# Patient Record
Sex: Male | Born: 2000 | Race: White | Hispanic: No | Marital: Single | Smoking: Never smoker
Health system: Southern US, Community
[De-identification: ages and names within clinical notes are randomized; demographics above are authoritative.]

---
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
## 2024-08-09 ENCOUNTER — Ambulatory Visit: Payer: Self-pay | Admitting: *Deleted

## 2024-08-09 NOTE — Telephone Encounter (Signed)
 FYI Only or Action Required?: FYI only for provider: recommended UC , no PCP.  Patient was last seen in primary care on na.  Called Nurse Triage reporting Sore Throat.  Symptoms began several days ago. 2 days ago   Interventions attempted: OTC medications: tylenol motrin.  Symptoms are: gradually worsening.  Triage Disposition: See Physician Within 24 Hours  Patient/caregiver understands and will follow disposition?: Yes     Recommended UC and patient declined scheduling new patient appt at this time. Recommended if sx worsen go to ED.              Copied from CRM #[id number]. Topic: Clinical - Red Word Triage >> Aug 09, 2024  9:28 AM Matthew Rice wrote: Red Word that prompted transfer to Nurse Triage: fever 101 this morning,sore throat, join pain, headaches from pressure, running nose, dizziness & nausea . Reason for Disposition  SEVERE throat pain (e.g., excruciating)  Answer Assessment - Initial Assessment Questions No PCP. Offered mobile bus or UC. Recommended getting new PCP. Patient reports he will go to UC.       1. ONSET: When did the throat start hurting? (Hours or days ago)      Yesterday  2. SEVERITY: How bad is the sore throat? (Scale 1-10; mild, moderate or severe)     Moderate to severe 3. STREP EXPOSURE: Has there been any exposure to strep within the past week? If Yes, ask: What type of contact occurred?      no 4.  VIRAL SYMPTOMS: Are there any symptoms of a cold, such as a runny nose, cough, hoarse voice or red eyes?      Runny nose , joint pain, headache, sore throat , fever 101.  Dizziness, cough  5. FEVER: Do you have a fever? If Yes, ask: What is your temperature, how was it measured, and when did it start?     101 this am took tylenol and motrin and fever decreased 6. PUS ON THE TONSILS: Is there pus on the tonsils in the back of your throat?     na 7. OTHER SYMPTOMS: Do you have any other symptoms? (e.g., difficulty  breathing, headache, rash)     Headache , joint pain, sore throat, cough with chest discomfort after coughing, dizziness, runny nose.  8. PREGNANCY: Is there any chance you are pregnant? When was your last menstrual period?     na  Protocols used: Sore Throat-A-AH
